# Patient Record
Sex: Male | Born: 1990
Health system: Southern US, Community
[De-identification: ages and names within clinical notes are randomized; demographics above are authoritative.]

---

## 2018-07-14 ENCOUNTER — Emergency Department (HOSPITAL_COMMUNITY): Payer: Self-pay

## 2018-07-14 ENCOUNTER — Encounter (HOSPITAL_COMMUNITY): Payer: Self-pay

## 2018-07-14 ENCOUNTER — Emergency Department (HOSPITAL_COMMUNITY)
Admission: EM | Admit: 2018-07-14 | Discharge: 2018-07-14 | Disposition: A | Discharge status: Home / Self Care | Payer: Self-pay | Attending: Emergency Medicine | Admitting: Emergency Medicine

## 2018-07-14 DIAGNOSIS — Y939 Activity, unspecified: Secondary | ICD-10-CM | POA: Insufficient documentation

## 2018-07-14 DIAGNOSIS — Y929 Unspecified place or not applicable: Secondary | ICD-10-CM | POA: Insufficient documentation

## 2018-07-14 DIAGNOSIS — M79672 Pain in left foot: Secondary | ICD-10-CM | POA: Insufficient documentation

## 2018-07-14 DIAGNOSIS — X509XXA Other and unspecified overexertion or strenuous movements or postures, initial encounter: Secondary | ICD-10-CM | POA: Insufficient documentation

## 2018-07-14 DIAGNOSIS — Y999 Unspecified external cause status: Secondary | ICD-10-CM | POA: Insufficient documentation

## 2018-07-14 MED ORDER — NAPROXEN 500 MG PO TABS: 500.0000 mg | ORAL_TABLET | Freq: Two times a day (BID) | ORAL | 0 refills | Status: AC | PRN

## 2018-07-14 NOTE — ED Triage Notes (Signed)
Pt reports injury to right foot and ankle while working 1 week ago. Bruising noted.

## 2018-07-14 NOTE — ED Notes (Signed)
Pt back in lobby.

## 2018-07-14 NOTE — ED Provider Notes (Signed)
[facility]   CSN: [id number] Arrival date & time: 07/14/18  1837     History   Chief Complaint Chief Complaint  Patient presents with  . Foot Pain    HPI Patty SermonsRafael Arregoitia Leticia ClasRivera is a 27 y.o. male.  The history is provided by the patient and medical records. A language interpreter was used (Spanish video interpreter).  Foot Pain   Shaka Arregoitia Leticia ClasRivera is an otherwise healthy 27 y.o. male who presents to the Emergency Department complaining of left foot pain since he hit his foot at work about a week ago.  Initially had a large amount of bruising which has now improved.  He is able to ambulate, however this does exacerbate his pain.  He has been applying antibiotic ointment to the foot and taking Tylenol.  He feels as if this is helping.  No numbness or weakness.  No open wounds.   History reviewed. No pertinent past medical history.  There are no active problems to display for this patient.   History reviewed. No pertinent surgical history.      Home Medications    Prior to Admission medications   Medication Sig Start Date End Date Taking? Authorizing Provider  naproxen (NAPROSYN) 500 MG tablet Take 1 tablet (500 mg total) by mouth 2 (two) times daily as needed. 07/14/18   Billal Rollo, Chase PicketJaime Pilcher, PA-C    Family History No family history on file.  Social History Social History   Tobacco Use  . Smoking status: Not on file  Substance Use Topics  . Alcohol use: Not on file  . Drug use: Not on file     Allergies   Patient has no allergy information on record.   Review of Systems Review of Systems  Musculoskeletal: Positive for arthralgias and myalgias. Negative for joint swelling.  Skin: Positive for color change (Ecchymosis). Negative for wound.     Physical Exam Updated Vital Signs BP 137/88 (BP Location: Right Wrist)   Pulse 72   Temp 99.1 F (37.3 C) (Oral)   Resp 16   SpO2 98%    Physical Exam Vitals signs and nursing note reviewed.  Constitutional:      General: He is not in acute distress.    Appearance: He is well-developed.  HENT:     Head: Normocephalic and atraumatic.  Neck:     Musculoskeletal: Neck supple.  Cardiovascular:     Rate and Rhythm: Normal rate and regular rhythm.     Heart sounds: Normal heart sounds. No murmur.  Pulmonary:     Effort: Pulmonary effort is normal. No respiratory distress.     Breath sounds: Normal breath sounds. No wheezing or rales.  Musculoskeletal:     Comments: Tenderness to palpation to the left medial malleolus and arch of the foot. Full ROM. 2+ DP. Sensation intact. Good cap refill. No joint swelling.   Skin:    General: Skin is warm and dry.  Neurological:     Mental Status: He is alert.      ED Treatments / Results  Labs (all labs ordered are listed, but only abnormal results are displayed) Labs Reviewed - No data to display  EKG None  Radiology Dg Ankle Complete Left  Result Date: 07/14/2018 CLINICAL DATA:  Medial foot pain and bruising after injury at work last week. EXAM: LEFT FOOT - COMPLETE 3+ VIEW; LEFT ANKLE COMPLETE - 3+ VIEW COMPARISON:  None. FINDINGS: No acute fracture or dislocation. The ankle  mortise is symmetric. The talar dome is intact. No tibiotalar joint effusion. Joint spaces are preserved. Bone mineralization is normal. Soft tissues are unremarkable. IMPRESSION: Negative left foot and ankle x-rays. Electronically Signed   By: Obie Dredge M.D.   On: 07/14/2018 22:14   Dg Foot Complete Left  Result Date: 07/14/2018 CLINICAL DATA:  Medial foot pain and bruising after injury at work last week. EXAM: LEFT FOOT - COMPLETE 3+ VIEW; LEFT ANKLE COMPLETE - 3+ VIEW COMPARISON:  None. FINDINGS: No acute fracture or dislocation. The ankle mortise is symmetric. The talar dome is intact. No tibiotalar joint effusion. Joint spaces are preserved. Bone mineralization is normal. Soft tissues are  unremarkable. IMPRESSION: Negative left foot and ankle x-rays. Electronically Signed   By: Obie Dredge M.D.   On: 07/14/2018 22:14    Procedures Procedures (including critical care time)  Medications Ordered in ED Medications - No data to display   Initial Impression / Assessment and Plan / ED Course  I have reviewed the triage vital signs and the nursing notes.  Pertinent labs & imaging results that were available during my care of the patient were reviewed by me and considered in my medical decision making (see chart for details).     Wildon Arregoitia Leticia Clas is a 27 y.o. male who presents to ED for left foot pain after injury at work about a week ago.  Neurovascularly intact on exam.  X-ray negative.  Symptomatic home care instructions discussed at length using interpreter. Follow up with ortho if symptoms persist another week. Return precautions discussed and all questions answered.    Final Clinical Impressions(s) / ED Diagnoses   Final diagnoses:  Foot pain, left    ED Discharge Orders         Ordered    naproxen (NAPROSYN) 500 MG tablet  2 times daily PRN     07/14/18 2235           Rusty Villella, Chase Picket, PA-C 07/14/18 2246    Gwyneth Sprout, MD 07/15/18 1905

## 2018-07-14 NOTE — ED Notes (Signed)
Pt stable, ambulatory, states understanding of discharge instructions 

## 2018-07-14 NOTE — ED Notes (Signed)
Patient transported to X-ray 

## 2018-07-14 NOTE — Discharge Instructions (Signed)
It was my pleasure taking care of you today!   Rest and stay off of the foot as much as possible over the next few days.   Ice area for pain relief.  Naproxen as needed for additional pain relief.   Call the orthopedist listed if symptoms are not improving in 1 week.   Return to ER for new or worsening sy

## 2018-07-14 NOTE — ED Notes (Signed)
Unable to locate patient.  Checked bathroom and called X-ray.

## 2018-07-14 NOTE — ED Notes (Signed)
Called X-ray to update pt is now in room 7

## 2019-03-11 ENCOUNTER — Other Ambulatory Visit: Payer: Self-pay | Admitting: Internal Medicine

## 2019-03-11 DIAGNOSIS — Z20822 Contact with and (suspected) exposure to covid-19: Secondary | ICD-10-CM

## 2019-03-13 LAB — SPECIMEN STATUS REPORT

## 2019-03-13 LAB — NOVEL CORONAVIRUS, NAA: SARS-CoV-2, NAA: NOT DETECTED

## 2019-09-25 IMAGING — DX DG FOOT COMPLETE 3+V*L*
3 series · 3 of 3 positions shown · non-contrast
Comparison: None.

CLINICAL DATA: Medial foot pain and bruising after injury at work
last week.

EXAM:
LEFT FOOT - COMPLETE 3+ VIEW; LEFT ANKLE COMPLETE - 3+ VIEW

[foot ap]
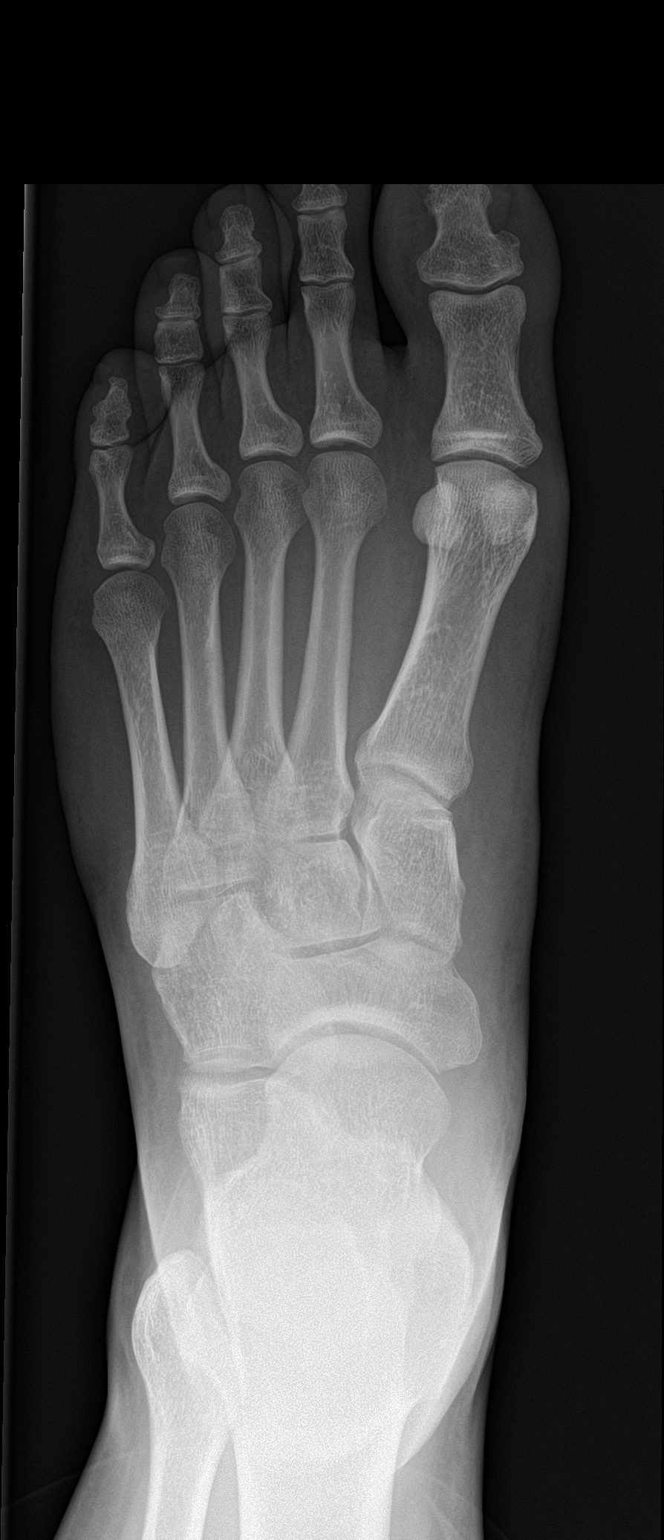

[foot obl]
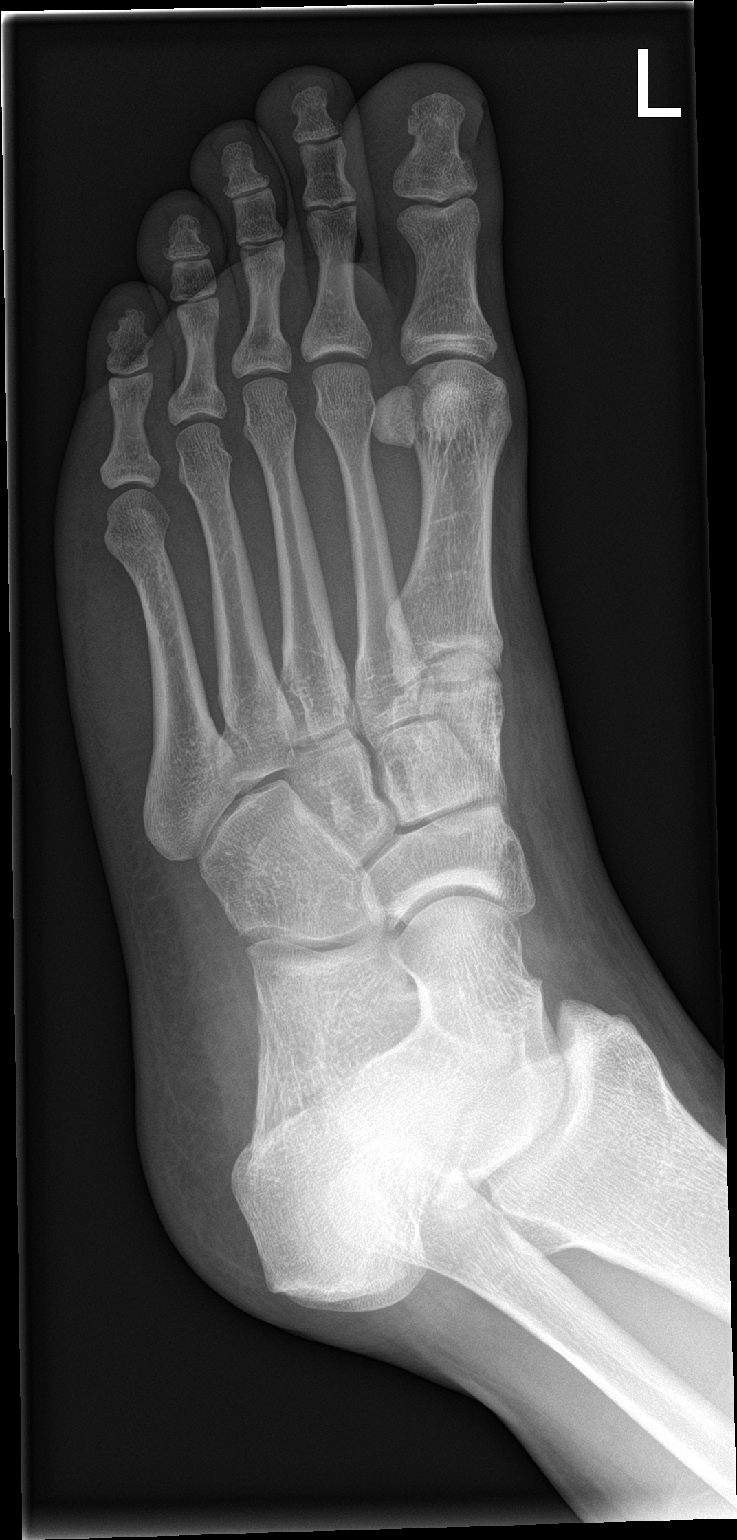

[foot lat]
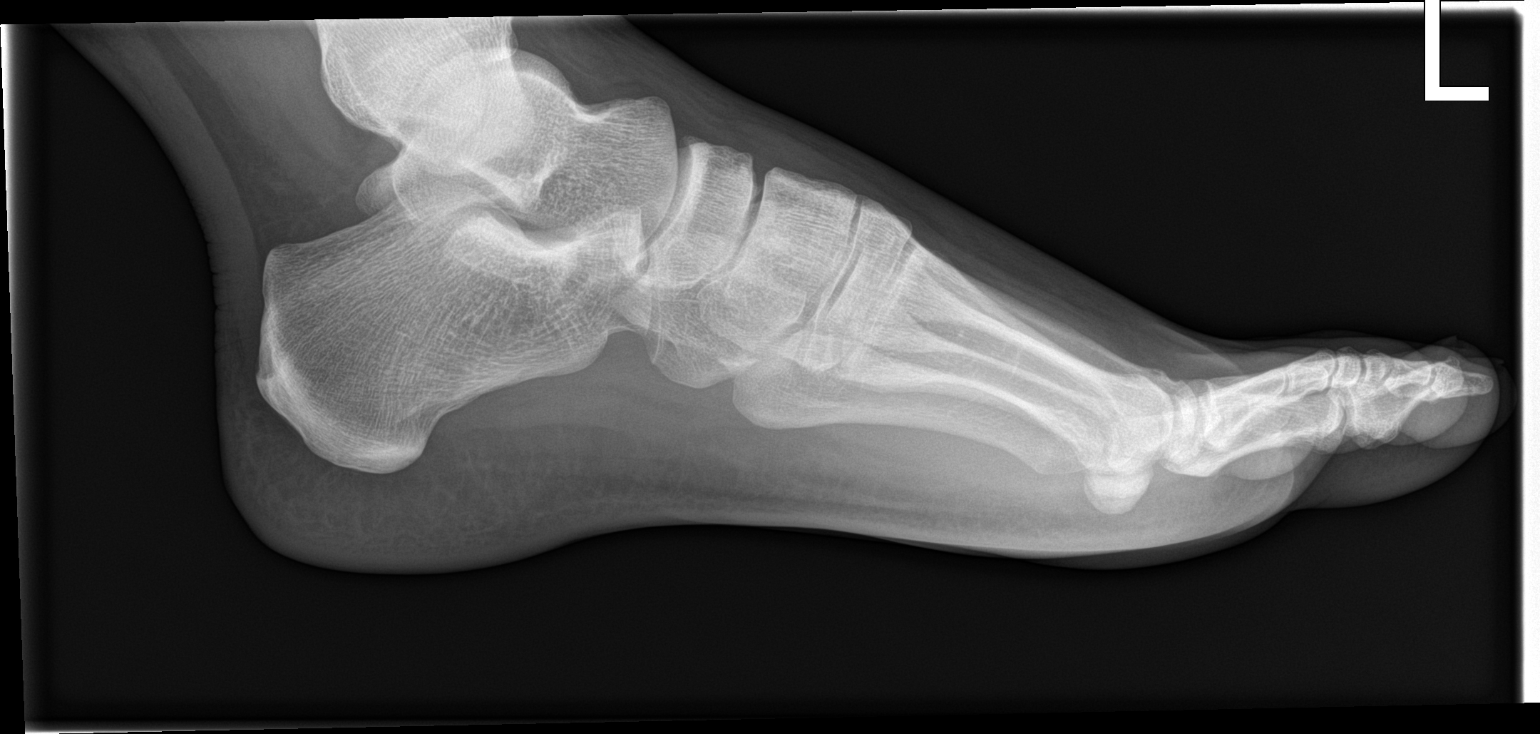

[3 of 3 positions shown; findings below may reference images not displayed]

FINDINGS: No acute fracture or dislocation. The ankle mortise is symmetric.
The talar dome is intact. No tibiotalar joint effusion. Joint spaces
are preserved. Bone mineralization is normal. Soft tissues are
unremarkable.
IMPRESSION: Negative left foot and ankle x-rays.

## 2024-05-10 ENCOUNTER — Ambulatory Visit: Payer: Self-pay

## 2024-05-10 ENCOUNTER — Telehealth: Payer: Self-pay

## 2024-05-10 NOTE — Telephone Encounter (Signed)
 Called pt to reschedule missed appt; could not reach or leave vm
